# Patient Record
Sex: Female | Born: 1985 | Race: White | Hispanic: No | Marital: Single | Smoking: Never smoker
Health system: Southern US, Community
[De-identification: ages and names within clinical notes are randomized; demographics above are authoritative.]

## PROBLEM LIST (undated history)

## (undated) DIAGNOSIS — Z789 Other specified health status: Secondary | ICD-10-CM

## (undated) HISTORY — PX: MOUTH SURGERY: SHX715

## (undated) HISTORY — PX: WISDOM TOOTH EXTRACTION: SHX21

---
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
## 2006-05-14 ENCOUNTER — Emergency Department (HOSPITAL_COMMUNITY): Admission: EM | Admit: 2006-05-14 | Discharge: 2006-05-14 | Payer: Self-pay | Admitting: Emergency Medicine

## 2007-01-30 ENCOUNTER — Emergency Department (HOSPITAL_COMMUNITY): Admission: EM | Admit: 2007-01-30 | Discharge: 2007-01-31 | Payer: Self-pay | Admitting: Emergency Medicine

## 2007-02-02 ENCOUNTER — Emergency Department (HOSPITAL_COMMUNITY): Admission: EM | Admit: 2007-02-02 | Discharge: 2007-02-02 | Payer: Self-pay | Admitting: Emergency Medicine

## 2007-04-07 ENCOUNTER — Emergency Department (HOSPITAL_COMMUNITY): Admission: EM | Admit: 2007-04-07 | Discharge: 2007-04-07 | Payer: Self-pay | Admitting: Emergency Medicine

## 2007-06-06 ENCOUNTER — Emergency Department (HOSPITAL_COMMUNITY): Admission: EM | Admit: 2007-06-06 | Discharge: 2007-06-06 | Payer: Self-pay | Admitting: Emergency Medicine

## 2007-08-05 ENCOUNTER — Emergency Department (HOSPITAL_COMMUNITY): Admission: EM | Admit: 2007-08-05 | Discharge: 2007-08-05 | Payer: Self-pay | Admitting: Emergency Medicine

## 2007-11-07 ENCOUNTER — Emergency Department (HOSPITAL_COMMUNITY): Admission: EM | Admit: 2007-11-07 | Discharge: 2007-11-07 | Payer: Self-pay | Admitting: Emergency Medicine

## 2008-02-03 ENCOUNTER — Emergency Department (HOSPITAL_COMMUNITY): Admission: EM | Admit: 2008-02-03 | Discharge: 2008-02-03 | Payer: Self-pay | Admitting: Emergency Medicine

## 2008-04-25 ENCOUNTER — Emergency Department (HOSPITAL_COMMUNITY): Admission: EM | Admit: 2008-04-25 | Discharge: 2008-04-25 | Payer: Self-pay | Admitting: Emergency Medicine

## 2009-05-18 ENCOUNTER — Emergency Department (HOSPITAL_COMMUNITY): Admission: EM | Admit: 2009-05-18 | Discharge: 2009-05-18 | Payer: Self-pay | Admitting: Emergency Medicine

## 2009-12-19 ENCOUNTER — Emergency Department (HOSPITAL_COMMUNITY): Admission: EM | Admit: 2009-12-19 | Discharge: 2009-12-19 | Payer: Self-pay | Admitting: Emergency Medicine

## 2010-05-06 ENCOUNTER — Emergency Department (HOSPITAL_COMMUNITY): Admission: EM | Admit: 2010-05-06 | Discharge: 2010-05-06 | Payer: Self-pay | Admitting: Emergency Medicine

## 2010-09-25 ENCOUNTER — Emergency Department (HOSPITAL_COMMUNITY): Admission: EM | Admit: 2010-09-25 | Discharge: 2010-09-25 | Payer: Self-pay | Admitting: Emergency Medicine

## 2011-01-11 LAB — COMPREHENSIVE METABOLIC PANEL
ALT: 29 U/L (ref 0–35)
AST: 23 U/L (ref 0–37)
Alkaline Phosphatase: 34 U/L — ABNORMAL LOW (ref 39–117)
Chloride: 104 mEq/L (ref 96–112)
Creatinine, Ser: 0.57 mg/dL (ref 0.4–1.2)
GFR calc Af Amer: >60 mL/min (ref >60)
Potassium: 3.4 mEq/L — ABNORMAL LOW (ref 3.5–5.1)
Sodium: 134 mEq/L — ABNORMAL LOW (ref 135–145)
Total Protein: 6.5 g/dL (ref 6.0–8.3)

## 2011-01-11 LAB — URINALYSIS, ROUTINE W REFLEX MICROSCOPIC
Hgb urine dipstick: NEGATIVE
Ketones, ur: NEGATIVE mg/dL
Nitrite: NEGATIVE
Protein, ur: NEGATIVE mg/dL
pH: 6 (ref 5.0–8.0)

## 2011-01-11 LAB — DIFFERENTIAL
Basophils Absolute: 0 10*3/uL (ref 0.0–0.1)
Lymphs Abs: 1.6 10*3/uL (ref 0.7–4.0)
Monocytes Absolute: 0.5 10*3/uL (ref 0.1–1.0)
Monocytes Relative: 4 % (ref 3–12)

## 2011-01-11 LAB — URINE CULTURE: Colony Count: NO GROWTH

## 2011-01-11 LAB — CBC
MCHC: 34.5 g/dL (ref 30.0–36.0)
Platelets: 232 10*3/uL (ref 150–400)
RBC: 4.15 MIL/uL (ref 3.87–5.11)
RDW: 13.5 % (ref 11.5–15.5)
WBC: 10.5 10*3/uL (ref 4.0–10.5)

## 2011-01-11 LAB — URINE MICROSCOPIC-ADD ON

## 2011-06-05 ENCOUNTER — Encounter: Payer: Self-pay | Admitting: *Deleted

## 2011-06-05 ENCOUNTER — Emergency Department (HOSPITAL_COMMUNITY)
Admission: EM | Admit: 2011-06-05 | Discharge: 2011-06-05 | Discharge status: Against Medical Advice | Payer: Medicaid Other | Attending: Emergency Medicine | Admitting: Emergency Medicine

## 2011-06-05 DIAGNOSIS — M79609 Pain in unspecified limb: Secondary | ICD-10-CM | POA: Insufficient documentation

## 2011-06-05 NOTE — ED Notes (Signed)
C/o redness, drainage noted from left pinkie finger, denies any injury,

## 2011-06-05 NOTE — ED Notes (Signed)
Called to registration desk by registration personal who advised that pt had left, registration clerk had pt sign ama form

## 2011-06-07 ENCOUNTER — Emergency Department (HOSPITAL_COMMUNITY)
Admission: EM | Admit: 2011-06-07 | Discharge: 2011-06-07 | Discharge status: Against Medical Advice | Payer: Medicaid Other | Attending: Emergency Medicine | Admitting: Emergency Medicine

## 2011-06-07 ENCOUNTER — Encounter (HOSPITAL_COMMUNITY): Payer: Self-pay

## 2011-06-07 DIAGNOSIS — R110 Nausea: Secondary | ICD-10-CM | POA: Insufficient documentation

## 2011-06-07 DIAGNOSIS — R109 Unspecified abdominal pain: Secondary | ICD-10-CM

## 2011-06-07 DIAGNOSIS — N898 Other specified noninflammatory disorders of vagina: Secondary | ICD-10-CM | POA: Insufficient documentation

## 2011-06-07 DIAGNOSIS — R1031 Right lower quadrant pain: Secondary | ICD-10-CM | POA: Insufficient documentation

## 2011-06-07 DIAGNOSIS — N946 Dysmenorrhea, unspecified: Secondary | ICD-10-CM

## 2011-06-07 DIAGNOSIS — Z5320 Procedure and treatment not carried out because of patient's decision for unspecified reasons: Secondary | ICD-10-CM | POA: Insufficient documentation

## 2011-06-07 LAB — URINE MICROSCOPIC-ADD ON

## 2011-06-07 LAB — URINALYSIS, ROUTINE W REFLEX MICROSCOPIC
Bilirubin Urine: NEGATIVE
Glucose, UA: NEGATIVE mg/dL
Ketones, ur: NEGATIVE mg/dL
Specific Gravity, Urine: >1.03 — ABNORMAL HIGH (ref 1.005–1.030)
pH: 6 (ref 5.0–8.0)

## 2011-06-07 LAB — BASIC METABOLIC PANEL
BUN: 9 mg/dL (ref 6–23)
Chloride: 99 mEq/L (ref 96–112)
Creatinine, Ser: 0.67 mg/dL (ref 0.50–1.10)
GFR calc non Af Amer: >60 mL/min (ref >60)

## 2011-06-07 LAB — CBC
HCT: 40.3 % (ref 36.0–46.0)
Hemoglobin: 13.3 g/dL (ref 12.0–15.0)
MCH: 27.6 pg (ref 26.0–34.0)
MCHC: 33 g/dL (ref 30.0–36.0)
MCV: 83.6 fL (ref 78.0–100.0)
Platelets: 252 10*3/uL (ref 150–400)
RDW: 14.4 % (ref 11.5–15.5)
WBC: 8.9 10*3/uL (ref 4.0–10.5)

## 2011-06-07 LAB — DIFFERENTIAL
Basophils Absolute: 0 10*3/uL (ref 0.0–0.1)
Basophils Relative: 0 % (ref 0–1)
Eosinophils Absolute: 0.6 10*3/uL (ref 0.0–0.7)
Eosinophils Relative: 7 % — ABNORMAL HIGH (ref 0–5)
Lymphs Abs: 3.4 10*3/uL (ref 0.7–4.0)
Neutro Abs: 4.3 10*3/uL (ref 1.7–7.7)
Neutrophils Relative %: 48 % (ref 43–77)

## 2011-06-07 LAB — PREGNANCY, URINE: Preg Test, Ur: NEGATIVE

## 2011-06-07 MED ORDER — OXYCODONE-ACETAMINOPHEN 5-325 MG PO TABS
1.0000 | ORAL_TABLET | Freq: Once | ORAL | Status: AC
  Administered 2011-06-07: 1 via ORAL
  Filled 2011-06-07: qty 1

## 2011-06-07 MED ORDER — KETOROLAC TROMETHAMINE 60 MG/2ML IM SOLN
60.0000 mg | Freq: Once | INTRAMUSCULAR | Status: AC
  Administered 2011-06-07: 60 mg via INTRAMUSCULAR
  Filled 2011-06-07: qty 2

## 2011-06-07 NOTE — ED Notes (Signed)
Patient left AMA, upset and stating "nobody has seen me, I'm going to see doctor tomorrow", dept. secretary called and notified of pt's leaving

## 2011-06-07 NOTE — ED Notes (Signed)
Accompanied PA to room to complete pelvic exam.  Room empty and pt's personal belongings gone.  Per pt, advocate, she went into room earlier to explain delay to her and pt's states "well, I don't need to wait, I can just go to my doctor."  Pt eloped before being discharged.

## 2011-06-07 NOTE — ED Notes (Addendum)
Pt reports to RLQ.  States pain in worse when she walks.  Pt reports she started Depo  Injection about 2 months ago, second injection due in 1 month. Pt reports she has been spotting for about 1 week.  Pt denies any nausea or vomiting.

## 2011-06-07 NOTE — ED Notes (Signed)
Complain of pain in right lower abd x 10 days.

## 2011-06-07 NOTE — ED Provider Notes (Signed)
History     CSN: [id number] Arrival date & time: 06/07/2011  7:19 PM  Chief Complaint  Patient presents with  . Abdominal Pain   Patient is a 25 y.o. female presenting with abdominal pain. The history is provided by the patient.  Abdominal Pain The primary symptoms of the illness include abdominal pain, nausea and vaginal bleeding. The primary symptoms of the illness do not include vomiting, diarrhea, dysuria or vaginal discharge. Episode onset: 10 days ago. The onset of the illness was gradual. The problem has not changed since onset. Onset: 10 days ago. Vaginal bleeding other than menses is a chronic problem. Vaginal bleeding is unchanged (She has been spotting daily for the past 10 days.  she is 4  months post partum.  This is her first menses since her pregnancy.) since it began. The quantity of blood was equivalent to spotting.  The patient states that she believes she is currently not pregnant. The patient has not had a change in bowel habit. Symptoms associated with the illness do not include chills, anorexia, diaphoresis, heartburn, constipation, hematuria, frequency or back pain. Associated symptoms comments: She had a normal vaginal delivery 4/12 with no postpartum complications.  She had a depo injection the first week of 6/12.Marland Kitchen    History reviewed. No pertinent past medical history.  Past Surgical History  Procedure Date  . Mouth surgery     History reviewed. No pertinent family history.  History  Substance Use Topics  . Smoking status: Never Smoker   . Smokeless tobacco: Not on file  . Alcohol Use: No    OB History    Grav Para Term Preterm Abortions TAB SAB Ect Mult Living                  Review of Systems  Constitutional: Negative for chills and diaphoresis.  Gastrointestinal: Positive for nausea and abdominal pain. Negative for heartburn, vomiting, diarrhea, constipation, abdominal distention and anorexia.  Genitourinary: Positive for vaginal bleeding. Negative  for dysuria, frequency, hematuria, flank pain, vaginal discharge and difficulty urinating.  Musculoskeletal: Negative for back pain.    Physical Exam  BP 116/62  Pulse 53  Temp(Src) 97.7 F (36.5 C) (Oral)  Resp 14  Ht 5\' 3"  (1.6 m)  Wt 189 lb (85.73 kg)  BMI 33.48 kg/m2  SpO2 96%  LMP 06/04/2011  Physical Exam  Vitals reviewed. Constitutional: She is oriented to person, place, and time. She appears well-developed and well-nourished.  HENT:  Head: Normocephalic and atraumatic.  Eyes: Conjunctivae are normal.  Neck: Normal range of motion.  Cardiovascular: Normal rate, regular rhythm, normal heart sounds and intact distal pulses.   Pulmonary/Chest: Effort normal and breath sounds normal. She has no wheezes.  Abdominal: Soft. Bowel sounds are normal. She exhibits no distension and no fluid wave. There is no hepatosplenomegaly. There is tenderness in the right lower quadrant and suprapubic area. There is no rigidity, no rebound, no guarding, no CVA tenderness and no tenderness at McBurney's point.  Musculoskeletal: Normal range of motion.  Neurological: She is alert and oriented to person, place, and time.  Skin: Skin is warm and dry.  Psychiatric: She has a normal mood and affect.    ED Course  Procedures  MDM Returned to room to complete pelvic exam.  Patient not in room, did not inform anyone she was leaving.      Candis Musa, PA 06/07/11 2258  Medical screening examination/treatment/procedure(s) were performed by non-physician practitioner and as supervising physician I  was immediately available for consultation/collaboration.  Jasmine Awe, MD 06/07/11 2324

## 2011-07-20 LAB — URINALYSIS, ROUTINE W REFLEX MICROSCOPIC
Ketones, ur: NEGATIVE
Urobilinogen, UA: 0.2
pH: 6

## 2011-07-20 LAB — PREGNANCY, URINE: Preg Test, Ur: NEGATIVE

## 2011-07-25 LAB — URINALYSIS, ROUTINE W REFLEX MICROSCOPIC
Bilirubin Urine: NEGATIVE
Glucose, UA: NEGATIVE
Ketones, ur: NEGATIVE
Nitrite: NEGATIVE
Protein, ur: NEGATIVE
Specific Gravity, Urine: >1.03 — ABNORMAL HIGH
Urobilinogen, UA: 0.2
pH: 5.5

## 2011-07-25 LAB — DIFFERENTIAL
Basophils Absolute: 0
Basophils Relative: 0
Eosinophils Absolute: 0.1
Neutro Abs: 4.6
Neutrophils Relative %: 77

## 2011-07-25 LAB — COMPREHENSIVE METABOLIC PANEL
ALT: 34
Alkaline Phosphatase: 43
BUN: 8
CO2: 24
GFR calc non Af Amer: >60
Glucose, Bld: 92
Potassium: 3.4 — ABNORMAL LOW
Sodium: 136
Total Bilirubin: 0.6

## 2011-07-25 LAB — CBC
HCT: 40.3
Hemoglobin: 13.9
RBC: 4.96

## 2011-07-25 LAB — STOOL CULTURE

## 2011-07-27 LAB — URINALYSIS, ROUTINE W REFLEX MICROSCOPIC
Nitrite: NEGATIVE
Specific Gravity, Urine: >1.03 — ABNORMAL HIGH
pH: 6

## 2011-07-27 LAB — CBC
MCHC: 34.8
MCV: 84.2
RBC: 4.42
RDW: 15.3

## 2011-07-27 LAB — DIFFERENTIAL
Eosinophils Relative: 1
Lymphocytes Relative: 7 — ABNORMAL LOW
Lymphs Abs: 0.6 — ABNORMAL LOW
Neutrophils Relative %: 89 — ABNORMAL HIGH

## 2011-07-27 LAB — COMPREHENSIVE METABOLIC PANEL
AST: 19
CO2: 25
Calcium: 9.2
Creatinine, Ser: 0.6
GFR calc Af Amer: >60
GFR calc non Af Amer: >60
Total Protein: 6.4

## 2011-07-27 LAB — HCG, QUANTITATIVE, PREGNANCY: hCG, Beta Chain, Quant, S: 67601 — ABNORMAL HIGH

## 2011-07-27 LAB — URINE MICROSCOPIC-ADD ON

## 2011-07-30 ENCOUNTER — Encounter (HOSPITAL_COMMUNITY): Payer: Self-pay | Admitting: Emergency Medicine

## 2011-07-30 ENCOUNTER — Emergency Department (HOSPITAL_COMMUNITY): Payer: Medicaid Other

## 2011-07-30 ENCOUNTER — Emergency Department (HOSPITAL_COMMUNITY)
Admission: EM | Admit: 2011-07-30 | Discharge: 2011-07-30 | Disposition: A | Discharge status: Home / Self Care | Payer: Medicaid Other | Attending: Emergency Medicine | Admitting: Emergency Medicine

## 2011-07-30 DIAGNOSIS — X500XXA Overexertion from strenuous movement or load, initial encounter: Secondary | ICD-10-CM | POA: Insufficient documentation

## 2011-07-30 DIAGNOSIS — IMO0002 Reserved for concepts with insufficient information to code with codable children: Secondary | ICD-10-CM | POA: Insufficient documentation

## 2011-07-30 DIAGNOSIS — M25519 Pain in unspecified shoulder: Secondary | ICD-10-CM | POA: Insufficient documentation

## 2011-07-30 DIAGNOSIS — S46911A Strain of unspecified muscle, fascia and tendon at shoulder and upper arm level, right arm, initial encounter: Secondary | ICD-10-CM

## 2011-07-30 MED ORDER — IBUPROFEN 800 MG PO TABS: 800.0000 mg | ORAL_TABLET | Freq: Three times a day (TID) | ORAL | Status: AC

## 2011-07-30 MED ORDER — HYDROCODONE-ACETAMINOPHEN 7.5-325 MG PO TABS: ORAL_TABLET | ORAL | Status: AC

## 2011-07-30 NOTE — ED Notes (Signed)
Shoulder immobilizer placed on R arm. Explained to pt. Discussed home management of immobilizer and pain meds.

## 2011-07-30 NOTE — ED Notes (Signed)
Pt c/o r shoulder pain after hearing a "pop" while lifting her son's car seat.

## 2011-07-30 NOTE — ED Provider Notes (Signed)
Medical screening examination/treatment/procedure(s) were performed by non-physician practitioner and as supervising physician I was immediately available for consultation/collaboration.   Benny Lennert, MD 07/30/11 2249

## 2011-07-30 NOTE — ED Provider Notes (Signed)
History     CSN: [id number] Arrival date & time: 07/30/2011  4:00 PM  Chief Complaint  Patient presents with  . Shoulder Pain    (Consider location/radiation/quality/duration/timing/severity/associated sxs/prior treatment) HPI Comments: Pt was lifting a infant car seat with infant when she heard a pop and felt a sharp pain in the right shoulder. She is unable to lift the right arm without severe pain.  Patient is a 25 y.o. female presenting with shoulder pain.  Shoulder Pain Pertinent negatives include no abdominal pain, arthralgias, chest pain, coughing or neck pain.    History reviewed. No pertinent past medical history.  Past Surgical History  Procedure Date  . Mouth surgery     History reviewed. No pertinent family history.  History  Substance Use Topics  . Smoking status: Never Smoker   . Smokeless tobacco: Not on file  . Alcohol Use: No    OB History    Grav Para Term Preterm Abortions TAB SAB Ect Mult Living   3 2   1  1   2       Review of Systems  Constitutional: Negative for activity change.       All ROS Neg except as noted in HPI  HENT: Negative for nosebleeds and neck pain.   Eyes: Negative for photophobia and discharge.  Respiratory: Negative for cough, shortness of breath and wheezing.   Cardiovascular: Negative for chest pain and palpitations.  Gastrointestinal: Negative for abdominal pain and blood in stool.  Genitourinary: Negative for dysuria, frequency and hematuria.  Musculoskeletal: Negative for back pain and arthralgias.  Skin: Negative.   Neurological: Negative for dizziness, seizures and speech difficulty.  Psychiatric/Behavioral: Negative for hallucinations and confusion.    Allergies  Review of patient's allergies indicates no known allergies.  Home Medications   Current Outpatient Rx  Name Route Sig Dispense Refill  . MEDROXYPROGESTERONE ACETATE 150 MG/ML IM SUSP Intramuscular Inject 150 mg into the muscle every 3 (three)  months.        BP 110/60  Pulse 72  Temp 98.5 F (36.9 C)  Resp 20  Ht 5\' 3"  (1.6 m)  Wt 184 lb (83.462 kg)  BMI 32.59 kg/m2  SpO2 98%  Physical Exam  Nursing note and vitals reviewed. Constitutional: She is oriented to person, place, and time. She appears well-developed and well-nourished.  Non-toxic appearance.  HENT:  Head: Normocephalic.  Right Ear: Tympanic membrane and external ear normal.  Left Ear: Tympanic membrane and external ear normal.  Eyes: EOM and lids are normal. Pupils are equal, round, and reactive to light.  Neck: Normal range of motion. Neck supple. Carotid bruit is not present.  Cardiovascular: Normal rate, regular rhythm, normal heart sounds, intact distal pulses and normal pulses.   Pulmonary/Chest: Breath sounds normal. No respiratory distress.  Abdominal: Soft. Bowel sounds are normal. There is no tenderness. There is no guarding.  Musculoskeletal: She exhibits tenderness.       Pain of the right shoulder to palpation and with any attempted movement. Pain with attempted adduction and abduction. No deformity. Mild to mod swelling.  Lymphadenopathy:       Head (right side): No submandibular adenopathy present.       Head (left side): No submandibular adenopathy present.    She has no cervical adenopathy.  Neurological: She is alert and oriented to person, place, and time. She has normal strength. No cranial nerve deficit or sensory deficit.  Skin: Skin is warm and dry.  Psychiatric: She has a  normal mood and affect. Her speech is normal.    ED Course: Ice applied to the right shoulder.  Procedures (including critical care time)  Labs Reviewed - No data to display No results found.   Dx: Shoulder strain (right)   MDM  I have reviewed nursing notes, vital signs, and all appropriate lab and imaging results for this patient.        Kathie Dike, [place] 07/30/11 [phone]

## 2011-08-17 LAB — DIFFERENTIAL
Basophils Absolute: 0
Lymphocytes Relative: 8 — ABNORMAL LOW
Monocytes Absolute: 0.8 — ABNORMAL HIGH
Neutro Abs: 12.8 — ABNORMAL HIGH
Neutrophils Relative %: 86 — ABNORMAL HIGH

## 2011-08-17 LAB — LIPASE, BLOOD: Lipase: 14

## 2011-08-17 LAB — URINALYSIS, ROUTINE W REFLEX MICROSCOPIC
Bilirubin Urine: NEGATIVE
Hgb urine dipstick: NEGATIVE
Protein, ur: NEGATIVE
Urobilinogen, UA: 0.2

## 2011-08-17 LAB — COMPREHENSIVE METABOLIC PANEL
Albumin: 3.8
BUN: 14
Chloride: 100
Creatinine, Ser: 0.87
Total Bilirubin: 1.1
Total Protein: 7.4

## 2011-08-17 LAB — CBC
HCT: 38.7
MCV: 82.3
Platelets: 271
RDW: 15 — ABNORMAL HIGH

## 2012-07-10 IMAGING — CR DG SHOULDER 2+V*R*
3 series · 3 of 3 positions shown · non-contrast
Comparison: None.

CLINICAL DATA: Pain

RIGHT SHOULDER - 2+ VIEW

[view not recorded (1 of 3)]
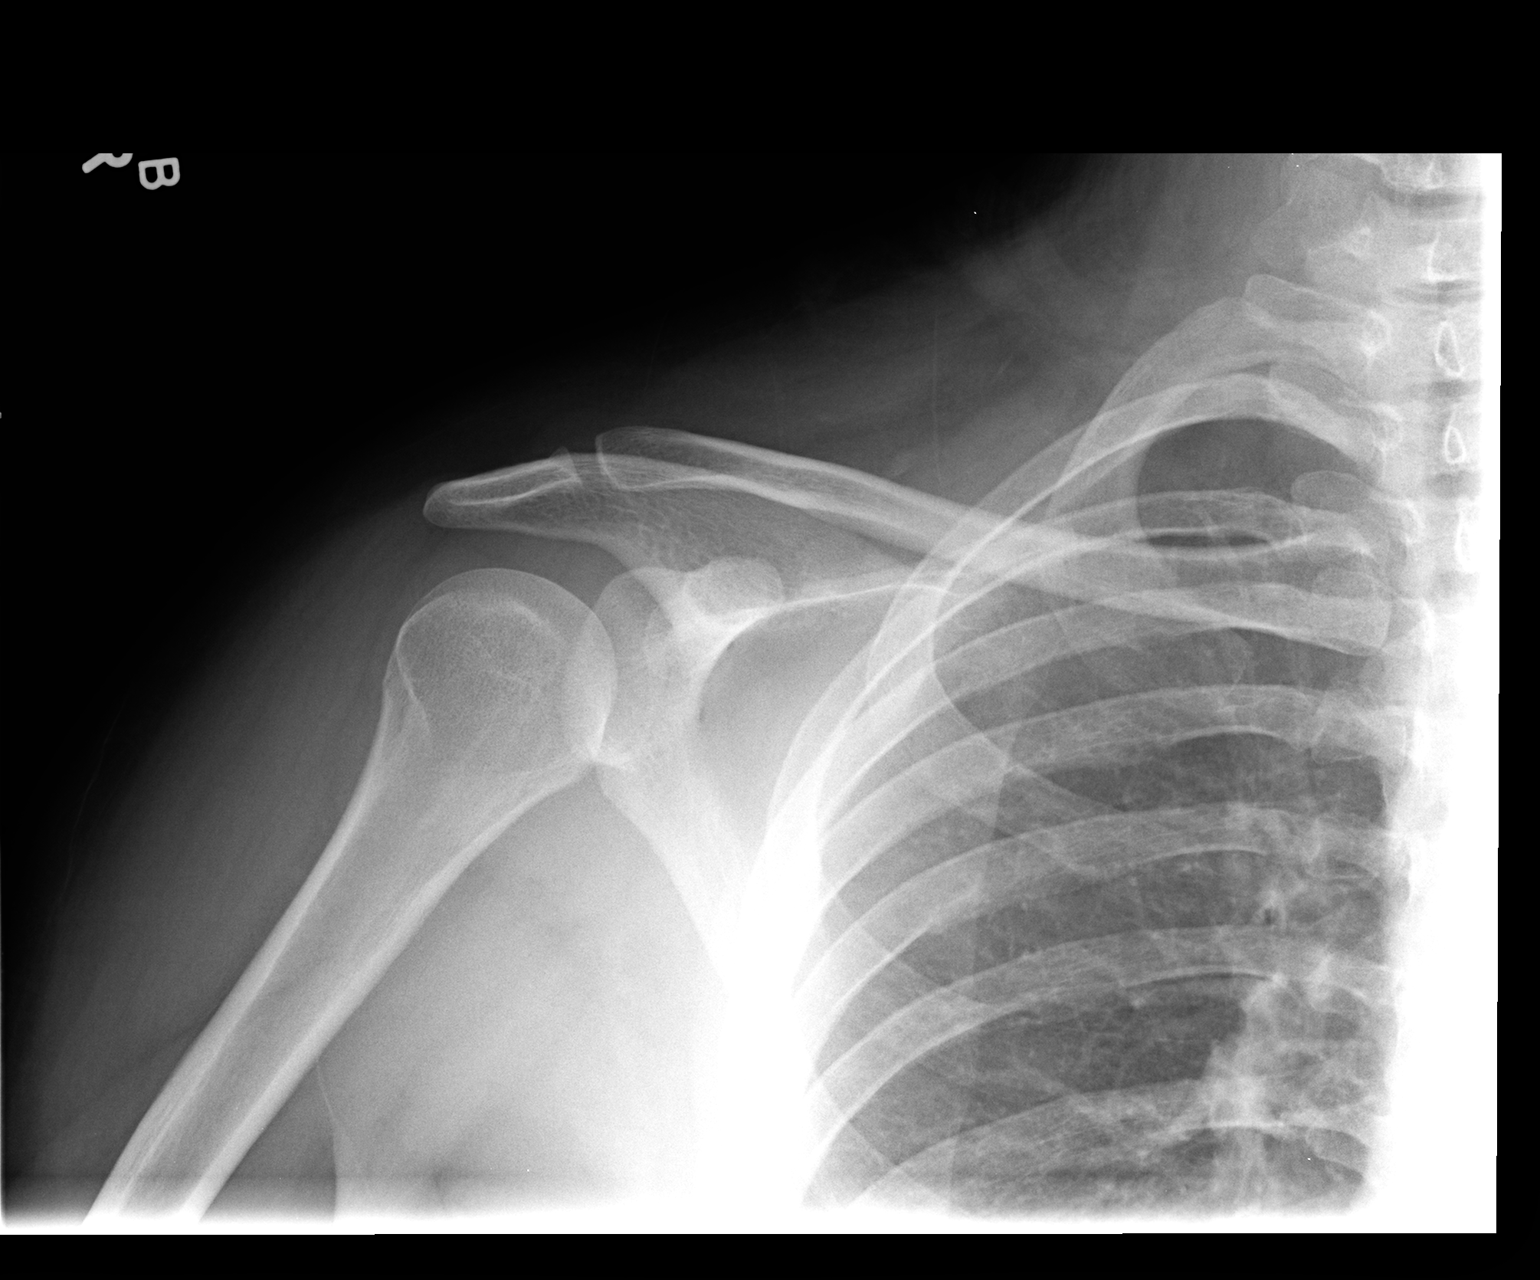

[view not recorded (2 of 3)]
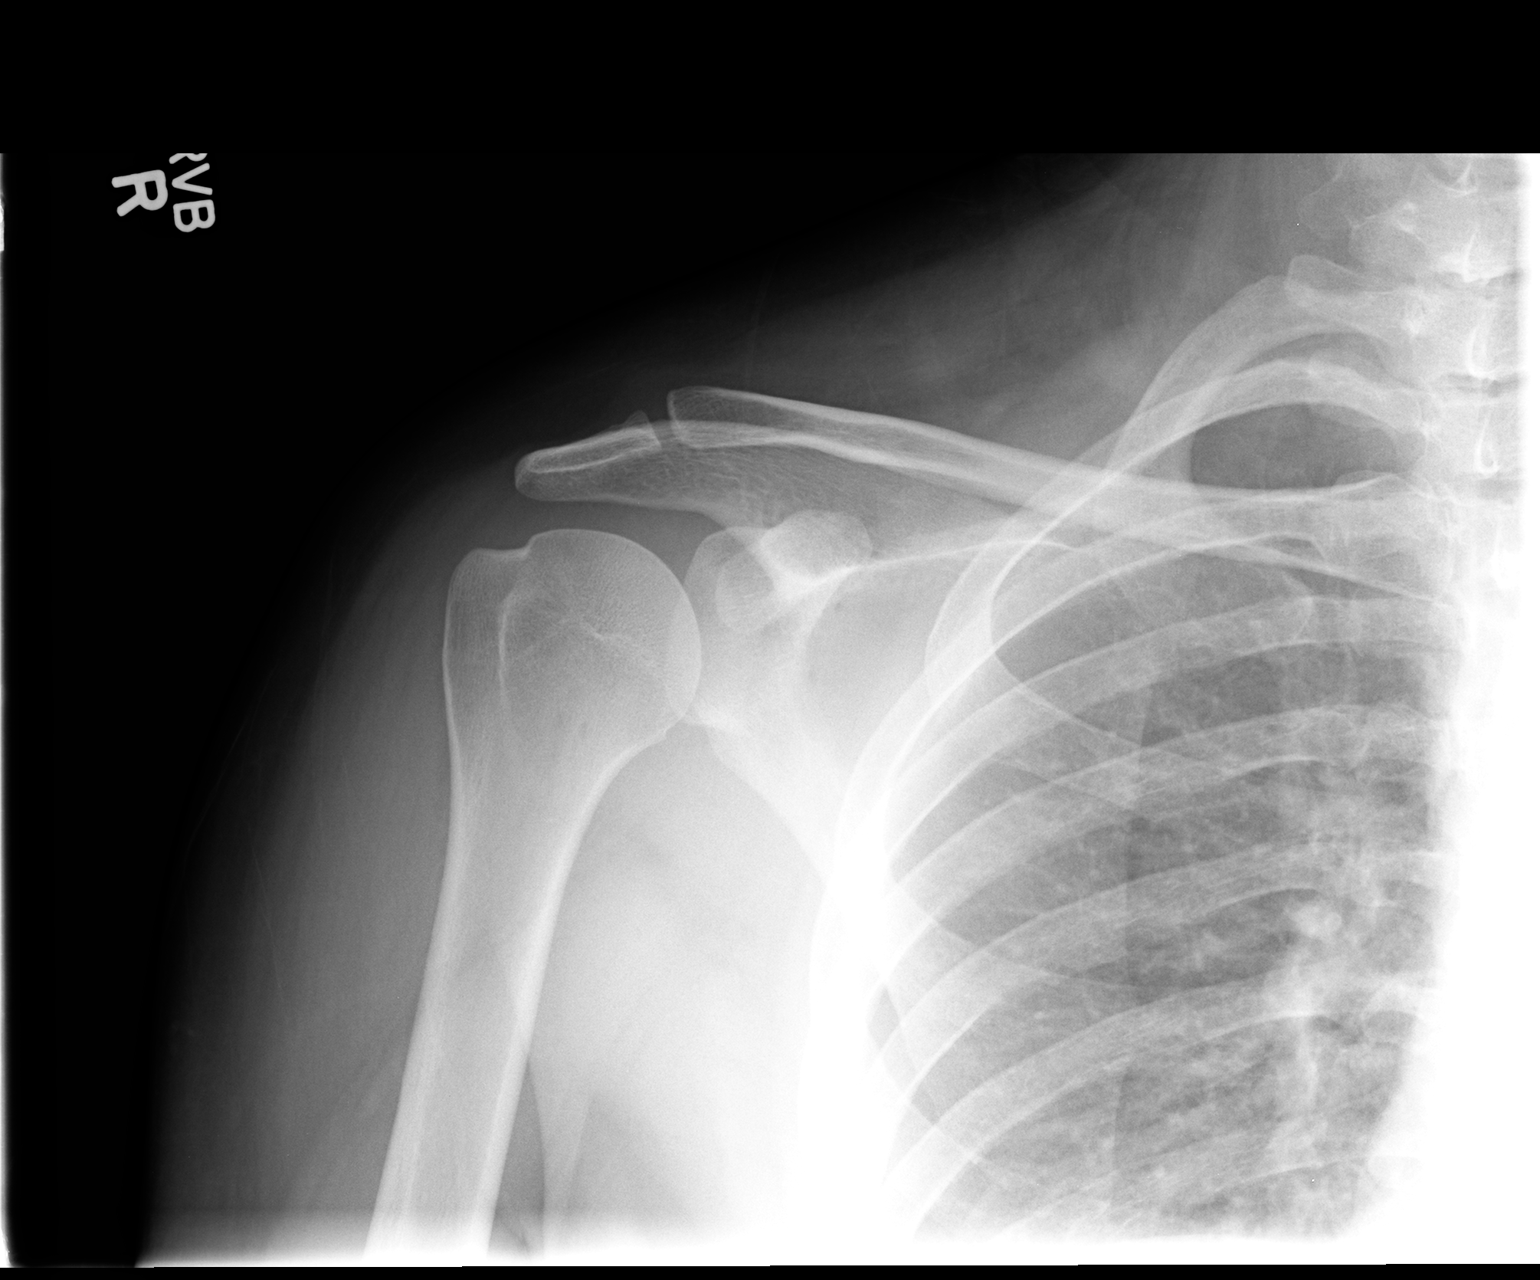

[view not recorded (3 of 3)]
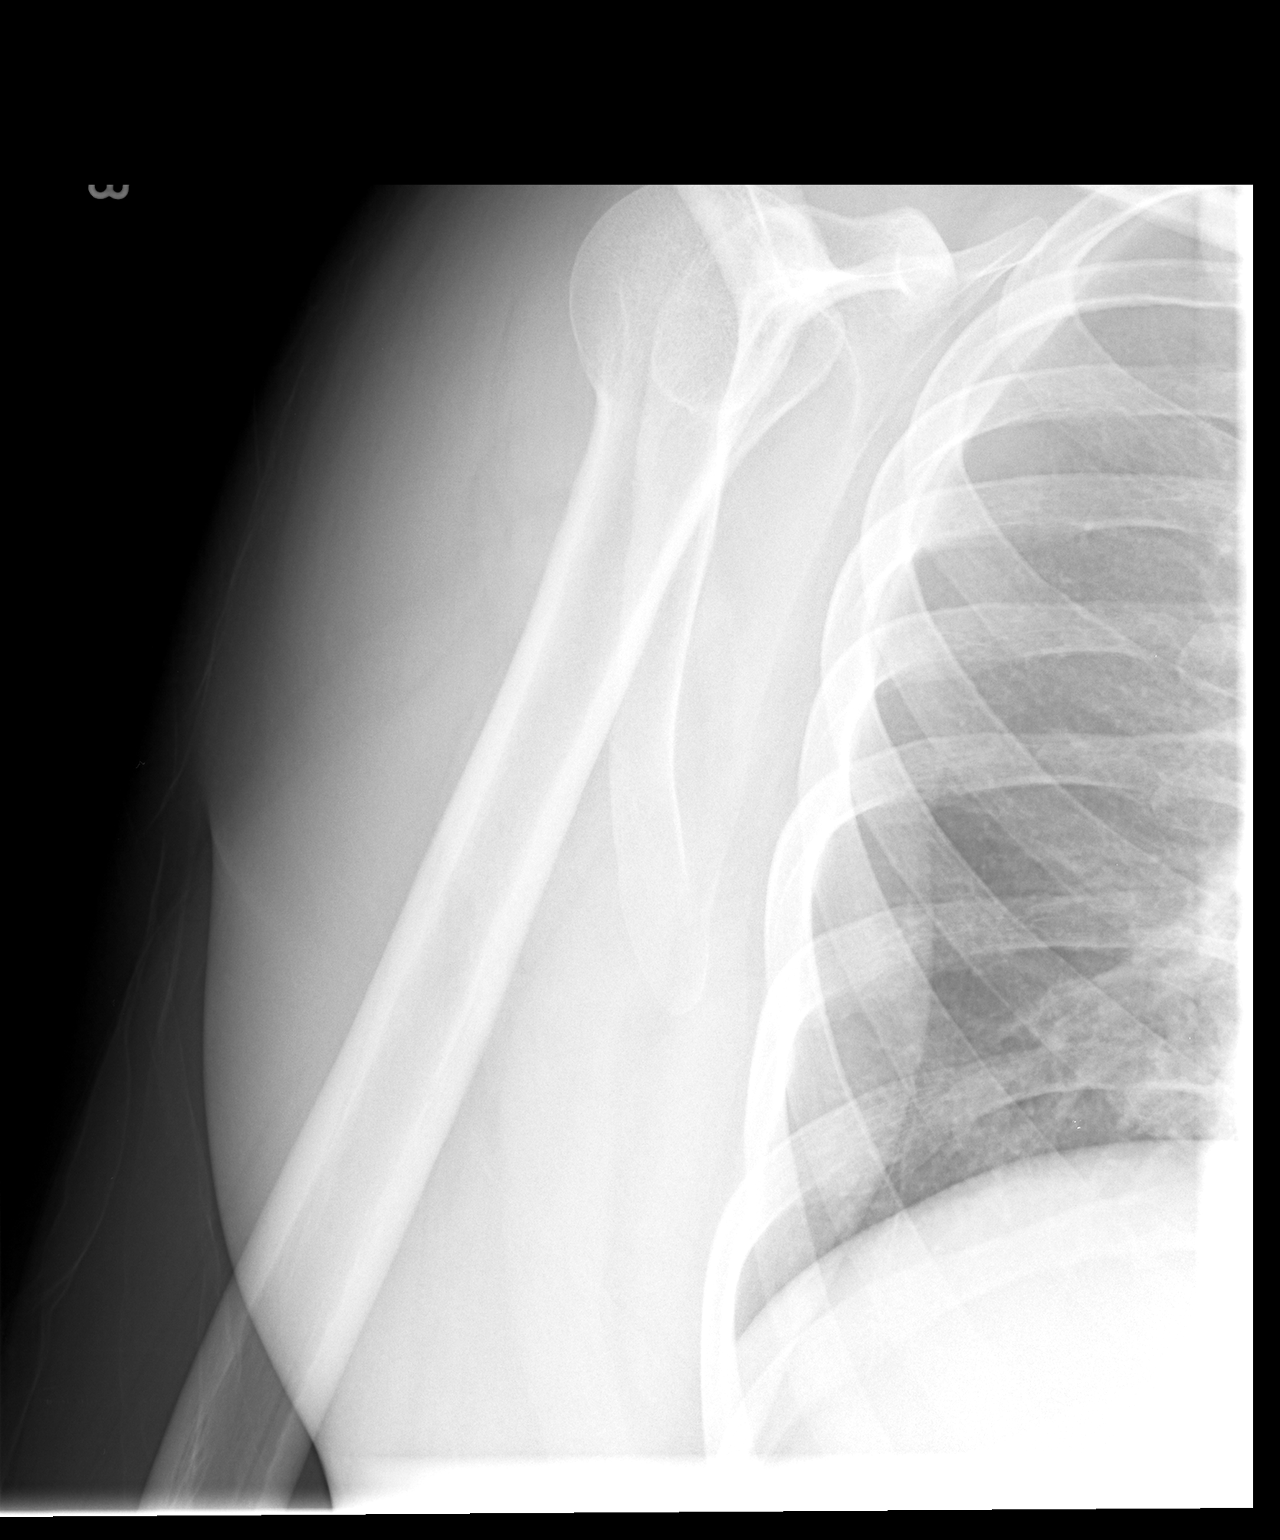

[3 of 3 positions shown; findings below may reference images not displayed]

FINDINGS: Three views of the right shoulder submitted.  No acute
fracture or subluxation.  No radiopaque foreign body.
IMPRESSION: No acute fracture or subluxation.

## 2014-08-31 ENCOUNTER — Encounter (HOSPITAL_COMMUNITY): Payer: Self-pay | Admitting: Emergency Medicine

## 2018-04-18 ENCOUNTER — Encounter (HOSPITAL_COMMUNITY): Payer: Self-pay

## 2018-04-28 ENCOUNTER — Other Ambulatory Visit: Payer: Self-pay

## 2018-05-03 ENCOUNTER — Ambulatory Visit (HOSPITAL_COMMUNITY)
Admission: RE | Admit: 2018-05-03 | Discharge: 2018-05-03 | Disposition: A | Discharge status: Home / Self Care | Payer: Medicaid Other | Source: Ambulatory Visit | Attending: Nurse Practitioner | Admitting: Nurse Practitioner

## 2018-05-29 ENCOUNTER — Encounter (HOSPITAL_COMMUNITY): Payer: Self-pay

## 2018-05-29 ENCOUNTER — Other Ambulatory Visit (HOSPITAL_COMMUNITY): Payer: Self-pay | Admitting: Nurse Practitioner

## 2018-05-29 DIAGNOSIS — Z3689 Encounter for other specified antenatal screening: Secondary | ICD-10-CM

## 2018-06-03 ENCOUNTER — Encounter (HOSPITAL_COMMUNITY): Payer: Self-pay | Admitting: *Deleted

## 2018-06-05 ENCOUNTER — Ambulatory Visit (HOSPITAL_COMMUNITY)
Admission: RE | Admit: 2018-06-05 | Discharge: 2018-06-05 | Disposition: A | Discharge status: Home / Self Care | Payer: Managed Care, Other (non HMO) | Source: Ambulatory Visit | Attending: Nurse Practitioner | Admitting: Nurse Practitioner

## 2018-06-05 ENCOUNTER — Other Ambulatory Visit (HOSPITAL_COMMUNITY): Payer: Self-pay | Admitting: *Deleted

## 2018-06-05 ENCOUNTER — Encounter (HOSPITAL_COMMUNITY): Payer: Self-pay

## 2018-06-05 ENCOUNTER — Other Ambulatory Visit: Payer: Self-pay

## 2018-06-05 ENCOUNTER — Ambulatory Visit (HOSPITAL_COMMUNITY): Payer: Self-pay

## 2018-06-05 ENCOUNTER — Other Ambulatory Visit (HOSPITAL_COMMUNITY): Payer: Self-pay | Admitting: Nurse Practitioner

## 2018-06-05 DIAGNOSIS — Z3A18 18 weeks gestation of pregnancy: Secondary | ICD-10-CM | POA: Insufficient documentation

## 2018-06-05 DIAGNOSIS — Z3689 Encounter for other specified antenatal screening: Secondary | ICD-10-CM

## 2018-06-05 DIAGNOSIS — Z363 Encounter for antenatal screening for malformations: Secondary | ICD-10-CM | POA: Diagnosis not present

## 2018-06-05 DIAGNOSIS — O99212 Obesity complicating pregnancy, second trimester: Secondary | ICD-10-CM

## 2018-06-05 DIAGNOSIS — O289 Unspecified abnormal findings on antenatal screening of mother: Secondary | ICD-10-CM

## 2018-06-05 DIAGNOSIS — Z362 Encounter for other antenatal screening follow-up: Secondary | ICD-10-CM

## 2018-06-05 HISTORY — DX: Other specified health status: Z78.9

## 2018-06-06 LAB — HEMOGLOBINOPATHY EVALUATION
HGB A2 QUANT: 2.6 % (ref 1.8–3.2)
HGB A: 97.4 % (ref 96.4–98.8)
HGB F QUANT: 0 % (ref 0.0–2.0)
Hgb C: 0 %
Hgb S Quant: 0 %
Hgb Variant: 0 %

## 2018-06-18 ENCOUNTER — Other Ambulatory Visit (HOSPITAL_COMMUNITY): Payer: Self-pay

## 2018-07-03 ENCOUNTER — Ambulatory Visit (HOSPITAL_COMMUNITY)
Admission: RE | Admit: 2018-07-03 | Discharge: 2018-07-03 | Disposition: A | Discharge status: Home / Self Care | Payer: Managed Care, Other (non HMO) | Source: Ambulatory Visit | Attending: Nurse Practitioner | Admitting: Nurse Practitioner

## 2018-07-03 ENCOUNTER — Encounter (HOSPITAL_COMMUNITY): Payer: Self-pay

## 2018-07-03 DIAGNOSIS — Z362 Encounter for other antenatal screening follow-up: Secondary | ICD-10-CM | POA: Diagnosis present

## 2018-07-03 DIAGNOSIS — Z3A22 22 weeks gestation of pregnancy: Secondary | ICD-10-CM | POA: Insufficient documentation

## 2018-07-03 DIAGNOSIS — O289 Unspecified abnormal findings on antenatal screening of mother: Secondary | ICD-10-CM | POA: Diagnosis present

## 2018-07-03 DIAGNOSIS — O99212 Obesity complicating pregnancy, second trimester: Secondary | ICD-10-CM | POA: Insufficient documentation

## 2019-01-27 ENCOUNTER — Encounter (HOSPITAL_COMMUNITY): Payer: Self-pay

## 2021-06-30 ENCOUNTER — Emergency Department (HOSPITAL_COMMUNITY): Payer: No Typology Code available for payment source

## 2021-06-30 ENCOUNTER — Encounter (HOSPITAL_COMMUNITY): Payer: Self-pay

## 2021-06-30 ENCOUNTER — Emergency Department (HOSPITAL_COMMUNITY)
Admission: EM | Admit: 2021-06-30 | Discharge: 2021-06-30 | Disposition: A | Discharge status: Home / Self Care | Payer: No Typology Code available for payment source | Attending: Student | Admitting: Student

## 2021-06-30 DIAGNOSIS — Z789 Other specified health status: Secondary | ICD-10-CM

## 2021-06-30 DIAGNOSIS — Z20822 Contact with and (suspected) exposure to covid-19: Secondary | ICD-10-CM | POA: Diagnosis not present

## 2021-06-30 DIAGNOSIS — R079 Chest pain, unspecified: Secondary | ICD-10-CM | POA: Diagnosis not present

## 2021-06-30 DIAGNOSIS — R000 Tachycardia, unspecified: Secondary | ICD-10-CM | POA: Insufficient documentation

## 2021-06-30 DIAGNOSIS — T50995A Adverse effect of other drugs, medicaments and biological substances, initial encounter: Secondary | ICD-10-CM | POA: Diagnosis not present

## 2021-06-30 DIAGNOSIS — R202 Paresthesia of skin: Secondary | ICD-10-CM | POA: Insufficient documentation

## 2021-06-30 LAB — CBC WITH DIFFERENTIAL/PLATELET
Abs Immature Granulocytes: 0.04 10*3/uL (ref 0.00–0.07)
Basophils Absolute: 0.1 10*3/uL (ref 0.0–0.1)
Basophils Relative: 1 %
Eosinophils Absolute: 0.2 10*3/uL (ref 0.0–0.5)
Eosinophils Relative: 2 %
HCT: 41.9 % (ref 36.0–46.0)
Hemoglobin: 13.7 g/dL (ref 12.0–15.0)
Immature Granulocytes: 0 %
Lymphocytes Relative: 16 %
Lymphs Abs: 1.5 10*3/uL (ref 0.7–4.0)
MCH: 28.4 pg (ref 26.0–34.0)
MCHC: 32.7 g/dL (ref 30.0–36.0)
MCV: 86.7 fL (ref 80.0–100.0)
Monocytes Absolute: 0.5 10*3/uL (ref 0.1–1.0)
Monocytes Relative: 5 %
Neutro Abs: 7 10*3/uL (ref 1.7–7.7)
Neutrophils Relative %: 76 %
Platelets: 344 10*3/uL (ref 150–400)
RBC: 4.83 MIL/uL (ref 3.87–5.11)
RDW: 13.2 % (ref 11.5–15.5)
WBC: 9.2 10*3/uL (ref 4.0–10.5)
nRBC: 0 % (ref 0.0–0.2)

## 2021-06-30 LAB — RESP PANEL BY RT-PCR (FLU A&B, COVID) ARPGX2
Influenza A by PCR: NEGATIVE
Influenza B by PCR: NEGATIVE
SARS Coronavirus 2 by RT PCR: NEGATIVE

## 2021-06-30 LAB — URINALYSIS, ROUTINE W REFLEX MICROSCOPIC
Bacteria, UA: NONE SEEN
Bilirubin Urine: NEGATIVE
Glucose, UA: NEGATIVE mg/dL
Ketones, ur: 5 mg/dL — AB
Leukocytes,Ua: NEGATIVE
Nitrite: NEGATIVE
Protein, ur: 30 mg/dL — AB
Specific Gravity, Urine: 1.006 (ref 1.005–1.030)
pH: 6 (ref 5.0–8.0)

## 2021-06-30 LAB — COMPREHENSIVE METABOLIC PANEL
ALT: 38 U/L (ref 0–44)
AST: 27 U/L (ref 15–41)
Albumin: 4 g/dL (ref 3.5–5.0)
Alkaline Phosphatase: 43 U/L (ref 38–126)
Anion gap: 7 (ref 5–15)
BUN: 11 mg/dL (ref 6–20)
CO2: 27 mmol/L (ref 22–32)
Calcium: 9.3 mg/dL (ref 8.9–10.3)
Chloride: 102 mmol/L (ref 98–111)
Creatinine, Ser: 0.76 mg/dL (ref 0.44–1.00)
GFR, Estimated: >60 mL/min (ref >60)
Glucose, Bld: 107 mg/dL — ABNORMAL HIGH (ref 70–99)
Potassium: 3.7 mmol/L (ref 3.5–5.1)
Sodium: 136 mmol/L (ref 135–145)
Total Bilirubin: 0.7 mg/dL (ref 0.3–1.2)
Total Protein: 7.9 g/dL (ref 6.5–8.1)

## 2021-06-30 LAB — BRAIN NATRIURETIC PEPTIDE: B Natriuretic Peptide: 13 pg/mL (ref 0.0–100.0)

## 2021-06-30 LAB — D-DIMER, QUANTITATIVE: D-Dimer, Quant: <0.27 ug/mL-FEU (ref 0.00–0.50)

## 2021-06-30 LAB — PREGNANCY, URINE: Preg Test, Ur: NEGATIVE

## 2021-06-30 LAB — TROPONIN I (HIGH SENSITIVITY)
Troponin I (High Sensitivity): 2 ng/L (ref <18)
Troponin I (High Sensitivity): 4 ng/L (ref <18)

## 2021-06-30 MED ORDER — ASPIRIN 325 MG PO TABS: 325.0000 mg | ORAL_TABLET | Freq: Once | ORAL | Status: DC

## 2021-06-30 NOTE — ED Triage Notes (Signed)
Pt complaining of chest pain, pt at work,  pt feeling a tingling sensation, pt was anxious, pt started phentermine Monday morning.  Pt alert and oriented, skin warm and dry.

## 2021-06-30 NOTE — Discharge Instructions (Signed)
Symptoms earlier today were likely related to the phentermine.  Please stop this medication.  Call your primary care provider tomorrow morning to arrange a follow-up appointment.  Return to emergency department for any new or worsening symptoms.

## 2021-06-30 NOTE — ED Provider Notes (Signed)
[facility] EMERGENCY DEPARTMENT Provider Note   CSN: [id number] Arrival date & time: 06/30/21  1627     History Chief Complaint  Patient presents with   Chest Pain    Valerie Dominguez is a 35 y.o. female.   Chest Pain Associated symptoms: no abdominal pain, no cough, no diaphoresis, no dizziness, no headache, no nausea, no shortness of breath and no vomiting       Valerie Dominguez is a 35 y.o. female who presents to the Emergency Department complaining of sudden onset of tingling to her face, chest, and both arms, chest pain, and increased heart rate.  Symptoms began shortly before ER arrival and occurred while at work.  She endorses recent increase stressors at her job, and not drinking enough water.  She also began taking phentermine 3 days ago.  She states this is a new medication for her.  She took the medication on Monday and Tuesday forgot her dose on Wednesday and took it again this morning.  She is also taking a prescription antibiotic for a cellulitis of her right lower arm.  She called EMS while at work, she was given 4 aspirin and 1 sublingual nitroglycerin.  Upon arrival to the ER she states that she was feeling better, but she is unsure if this is from being reassured or if the actual nitroglycerin helped.  She denies any history of PE/DVT, recent illness, cough, and recent travel.  She does not currently take any birth control.  She is a non-smoker.  Past Medical History:  Diagnosis Date   Medical history non-contributory     There are no problems to display for this patient.   Past Surgical History:  Procedure Laterality Date   MOUTH SURGERY     WISDOM TOOTH EXTRACTION       OB History     Gravida  3   Para  2   Term  2   Preterm      AB  0   Living  2      SAB  0   IAB      Ectopic      Multiple      Live Births              Family History  Problem Relation Age of Onset   Asthma Father    Cancer Father    Hyperlipidemia  Paternal Grandmother    Hypertension Paternal Grandmother    Hyperlipidemia Paternal Grandfather     Social History   Tobacco Use   Smoking status: Never   Smokeless tobacco: Never  Substance Use Topics   Alcohol use: No   Drug use: No    Home Medications Prior to Admission medications   Medication Sig Start Date End Date Taking? Authorizing Provider  Acetaminophen (TYLENOL PO) Take by mouth.    [provider]  FOLIC ACID PO Take by mouth.    [provider]  HYDROcodone-acetaminophen (NORCO) 7.5-325 MG per tablet 1 po q4h prn pain Patient not taking: Reported on 06/05/2018 07/30/11   Ivery Quale, PA-C  medroxyPROGESTERone (DEPO-PROVERA) 150 MG/ML injection Inject 150 mg into the muscle every 3 (three) months.      [provider]  Prenatal Vit-Fe Fumarate-FA (PRENATAL VITAMIN PO) Take by mouth.    [provider]    Allergies    Patient has no known allergies.  Review of Systems   Review of Systems  Constitutional:  Negative for chills and diaphoresis.  Eyes:  Negative for visual disturbance.  Respiratory:  Positive for chest tightness. Negative for cough, shortness of breath and wheezing.   Cardiovascular:  Positive for chest pain.  Gastrointestinal:  Negative for abdominal pain, nausea and vomiting.  Genitourinary:  Negative for difficulty urinating and dysuria.  Musculoskeletal:  Negative for arthralgias.  Skin:  Negative for rash.  Neurological:  Negative for dizziness, syncope, facial asymmetry, speech difficulty and headaches.       Tingling sensation of her face, chest, and bilateral arms   Physical Exam Updated Vital Signs BP (!) 141/79   Pulse (!) 121   Temp 98.7 F (37.1 C) (Oral)   Resp 18   Ht 5\' 4"  (1.626 m)   Wt 104.3 kg   LMP 06/21/2021   SpO2 97%   BMI 39.48 kg/m   Physical Exam Vitals and nursing note reviewed.  Constitutional:      Appearance: She is well-developed. She is not ill-appearing or  toxic-appearing.  HENT:     Head: Normocephalic.     Mouth/Throat:     Mouth: Mucous membranes are moist.  Eyes:     Extraocular Movements: Extraocular movements intact.     Conjunctiva/sclera: Conjunctivae normal.  Cardiovascular:     Rate and Rhythm: Regular rhythm. Tachycardia present.     Pulses: Normal pulses.  Pulmonary:     Effort: Pulmonary effort is normal. No respiratory distress.     Breath sounds: Normal breath sounds. No wheezing.  Chest:     Chest wall: No tenderness.  Abdominal:     Palpations: Abdomen is soft.     Tenderness: There is no abdominal tenderness.  Musculoskeletal:     Cervical back: Normal range of motion.  Skin:    General: Skin is warm.     Capillary Refill: Capillary refill takes less than 2 seconds.     Findings: No rash.  Neurological:     General: No focal deficit present.     Mental Status: She is alert and oriented to person, place, and time.     GCS: GCS eye subscore is 4. GCS verbal subscore is 5. GCS motor subscore is 6.     Cranial Nerves: Cranial nerves are intact.     Sensory: Sensation is intact.     Motor: No weakness.     Coordination: Coordination is intact.     Comments: Cranial nerves II through XII intact.  Speech clear.  No facial droop or pronator drift.  Normal finger-nose and heel shin testing.    ED Results / Procedures / Treatments   Labs (all labs ordered are listed, but only abnormal results are displayed) Labs Reviewed  URINALYSIS, ROUTINE W REFLEX MICROSCOPIC - Abnormal; Notable for the following components:      Result Value   Hgb urine dipstick SMALL (*)    Ketones, ur 5 (*)    Protein, ur 30 (*)    All other components within normal limits  COMPREHENSIVE METABOLIC PANEL - Abnormal; Notable for the following components:   Glucose, Bld 107 (*)    All other components within normal limits  RESP PANEL BY RT-PCR (FLU A&B, COVID) ARPGX2  CBC WITH DIFFERENTIAL/PLATELET  BRAIN NATRIURETIC PEPTIDE  D-DIMER,  QUANTITATIVE  PREGNANCY, URINE  TROPONIN I (HIGH SENSITIVITY)  TROPONIN I (HIGH SENSITIVITY)    EKG EKG Interpretation  Date/Time:  Thursday June 30 2021 16:39:27 EDT Ventricular Rate:  121 PR Interval:  135 QRS Duration: 89 QT Interval:  302 QTC Calculation: 429 R  Axis:   76 Text Interpretation: Sinus tachycardia Abnormal T, consider ischemia, diffuse leads Baseline wander in lead(s) II III aVF no prior for comparison wandering baseline Confirmed by Theda Belfast ([id number]) on 07/01/2021 12:14:23 PM   EKG Interpretation  Date/Time: 06/30/21  Ventricular Rate: 121  PR Interval: 135  QRS Duration: 89 QT Interval: 302  QTC Calculation: 429  R Axis:    Text Interpretation: Sinus tachycardia with abnormal T wave, consider ischemia.  No prior EKG for comparison.  EKG reviewed by Dr. Posey Rea  Radiology DG Chest Portable 1 View  Result Date: 06/30/2021 CLINICAL DATA:  Anxiety and chest pain. EXAM: PORTABLE CHEST 1 VIEW COMPARISON:  None. FINDINGS: The heart size and mediastinal contours are within normal limits. Both lungs are clear. The visualized skeletal structures are unremarkable. IMPRESSION: No active disease. Electronically Signed   By: Aram Candela M.D.   On: 06/30/2021 18:22    Procedures Procedures   Medications Ordered in ED Medications - No data to display  ED Course  I have reviewed the triage vital signs and the nursing notes.  Pertinent labs & imaging results that were available during my care of the patient were reviewed by me and considered in my medical decision making (see chart for details).   Patient here with sudden onset of tachycardia, facial and upper extremity paresthesias. started phentermine this week for weight loss.  On arrival, patient.  Very anxious, tachycardic and hypertensive.  No headache, chest pain or shortness of breath.  Overall, work-up reassuring.  D-dimer unremarkable.  No acute ischemic changes on EKG.  Chest x-ray without  acute findings.  COVID-negative.  No leukocytosis or significant electrolyte derangement.  Urinalysis without evidence of infection and patient is not pregnant.  Delta troponin without significant change.  BNP unremarkable.  Clinical suspicion for PE or ACS is low.    On recheck, patient resting comfortably.  Blood pressur now 130 systolic and heart rate of 91.  She has tolerated oral fluids without difficulty.  Denies any symptoms at present.  I feel that her symptoms from earlier today is likely secondary to the phentermine.  We will have patient discontinue this medicine and she is agreeable to close outpatient follow-up with PCP.  Return precautions were discussed.   MDM Rules/Calculators/A&P                            Final Clinical Impression(s) / ED Diagnoses Final diagnoses:  Medication intolerance    Rx / DC Orders ED Discharge Orders     None        Pauline Aus, PA-C 07/04/21 1623    Kommor, [place], MD 07/05/21 [phone]
